# Patient Record
Sex: Male | Born: 1955
Health system: Western US, Academic
[De-identification: ages and names within clinical notes are randomized; demographics above are authoritative.]

---

## 2012-08-17 ENCOUNTER — Ambulatory Visit: Payer: Self-pay | Admitting: Surgery

## 2012-08-17 LAB — CBC WITH DIFFERENTIAL/PLATELET
Basophil #: 0.1 10*3/uL (ref 0.0–0.1)
Basophil %: 1 %
HCT: 45.1 % (ref 40.0–52.0)
Lymphocyte #: 1.9 10*3/uL (ref 1.0–3.6)
MCH: 32.3 pg (ref 26.0–34.0)
Neutrophil %: 67.4 %
Platelet: 285 10*3/uL (ref 150–440)
RBC: 4.67 10*6/uL (ref 4.40–5.90)
WBC: 8.1 10*3/uL (ref 3.8–10.6)

## 2012-08-17 LAB — BASIC METABOLIC PANEL
Anion Gap: 3 — ABNORMAL LOW (ref 7–16)
BUN: 8 mg/dL (ref 7–18)
Calcium, Total: 8.6 mg/dL (ref 8.5–10.1)
Chloride: 103 mmol/L (ref 98–107)
Co2: 28 mmol/L (ref 21–32)
Creatinine: 0.98 mg/dL (ref 0.60–1.30)
EGFR (African American): >60
Glucose: 94 mg/dL (ref 65–99)
Osmolality: 266 (ref 275–301)
Potassium: 5.1 mmol/L (ref 3.5–5.1)
Sodium: 134 mmol/L — ABNORMAL LOW (ref 136–145)

## 2012-09-08 ENCOUNTER — Ambulatory Visit: Payer: Self-pay | Admitting: Surgery

## 2014-03-21 ENCOUNTER — Ambulatory Visit: Payer: Self-pay | Admitting: Surgery

## 2014-03-21 LAB — CREATININE, SERUM
CREATININE: 1.3 mg/dL (ref 0.60–1.30)
EGFR (Non-African Amer.): 60 — ABNORMAL LOW

## 2014-04-04 ENCOUNTER — Ambulatory Visit: Payer: Self-pay | Admitting: Urology

## 2014-04-04 LAB — URINALYSIS, COMPLETE
Glucose,UR: NEGATIVE mg/dL (ref 0–75)
Ketone: NEGATIVE
Nitrite: NEGATIVE
PH: 5 (ref 4.5–8.0)
SPECIFIC GRAVITY: 1.005 (ref 1.003–1.030)
Squamous Epithelial: <1
WBC UR: 11 /HPF (ref 0–5)

## 2014-04-04 LAB — CBC
HCT: 45 % (ref 40.0–52.0)
HGB: 14.8 g/dL (ref 13.0–18.0)
MCH: 31.8 pg (ref 26.0–34.0)
MCHC: 32.9 g/dL (ref 32.0–36.0)
MCV: 97 fL (ref 80–100)
PLATELETS: 298 10*3/uL (ref 150–440)
RBC: 4.65 10*6/uL (ref 4.40–5.90)
RDW: 14 % (ref 11.5–14.5)
WBC: 8.7 10*3/uL (ref 3.8–10.6)

## 2014-04-04 LAB — BASIC METABOLIC PANEL
Anion Gap: 9 (ref 7–16)
BUN: 15 mg/dL (ref 7–18)
CHLORIDE: 97 mmol/L — AB (ref 98–107)
CREATININE: 1.23 mg/dL (ref 0.60–1.30)
Calcium, Total: 8.5 mg/dL (ref 8.5–10.1)
Co2: 26 mmol/L (ref 21–32)
EGFR (Non-African Amer.): >60
Glucose: 97 mg/dL (ref 65–99)
Osmolality: 265 (ref 275–301)
POTASSIUM: 4.3 mmol/L (ref 3.5–5.1)
Sodium: 132 mmol/L — ABNORMAL LOW (ref 136–145)

## 2014-04-06 LAB — URINE CULTURE

## 2014-04-11 ENCOUNTER — Emergency Department: Payer: Self-pay | Admitting: Emergency Medicine

## 2014-04-11 LAB — URINALYSIS, COMPLETE
Bacteria: NONE SEEN
Bilirubin,UR: NEGATIVE
Glucose,UR: NEGATIVE mg/dL (ref 0–75)
Hyaline Cast: 14
KETONE: NEGATIVE
NITRITE: NEGATIVE
PH: 6 (ref 4.5–8.0)
Protein: 100
RBC,UR: 673 /HPF (ref 0–5)
SQUAMOUS EPITHELIAL: NONE SEEN
Specific Gravity: 1.009 (ref 1.003–1.030)

## 2014-04-11 LAB — COMPREHENSIVE METABOLIC PANEL
ALT: 23 U/L
Albumin: 3.3 g/dL — ABNORMAL LOW (ref 3.4–5.0)
Alkaline Phosphatase: 76 U/L
Anion Gap: 5 — ABNORMAL LOW (ref 7–16)
BUN: 10 mg/dL (ref 7–18)
Bilirubin,Total: 0.3 mg/dL (ref 0.2–1.0)
CO2: 35 mmol/L — AB (ref 21–32)
Calcium, Total: 9.1 mg/dL (ref 8.5–10.1)
Chloride: 98 mmol/L (ref 98–107)
Creatinine: 1.35 mg/dL — ABNORMAL HIGH (ref 0.60–1.30)
EGFR (African American): >60
EGFR (Non-African Amer.): 57 — ABNORMAL LOW
Glucose: 106 mg/dL — ABNORMAL HIGH (ref 65–99)
Osmolality: 275 (ref 275–301)
Potassium: 3.9 mmol/L (ref 3.5–5.1)
SGOT(AST): 21 U/L (ref 15–37)
Sodium: 138 mmol/L (ref 136–145)
Total Protein: 7.3 g/dL (ref 6.4–8.2)

## 2014-04-11 LAB — CBC
HCT: 44.8 % (ref 40.0–52.0)
HGB: 14.5 g/dL (ref 13.0–18.0)
MCH: 31.5 pg (ref 26.0–34.0)
MCHC: 32.3 g/dL (ref 32.0–36.0)
MCV: 98 fL (ref 80–100)
Platelet: 324 10*3/uL (ref 150–440)
RBC: 4.59 10*6/uL (ref 4.40–5.90)
RDW: 13.6 % (ref 11.5–14.5)
WBC: 9.5 10*3/uL (ref 3.8–10.6)

## 2014-04-11 LAB — DRUG SCREEN, URINE
Amphetamines, Ur Screen: NEGATIVE (ref ?–1000)
BENZODIAZEPINE, UR SCRN: NEGATIVE (ref ?–200)
Barbiturates, Ur Screen: NEGATIVE (ref ?–200)
COCAINE METABOLITE, UR ~~LOC~~: NEGATIVE (ref ?–300)
Cannabinoid 50 Ng, Ur ~~LOC~~: NEGATIVE (ref ?–50)
MDMA (ECSTASY) UR SCREEN: NEGATIVE (ref ?–500)
Methadone, Ur Screen: NEGATIVE (ref ?–300)
Opiate, Ur Screen: POSITIVE (ref ?–300)
PHENCYCLIDINE (PCP) UR S: NEGATIVE (ref ?–25)
TRICYCLIC, UR SCREEN: NEGATIVE (ref ?–1000)

## 2014-04-11 LAB — ETHANOL

## 2014-04-18 ENCOUNTER — Ambulatory Visit: Payer: Self-pay | Admitting: Urology

## 2014-04-26 ENCOUNTER — Emergency Department: Payer: Self-pay | Admitting: Internal Medicine

## 2014-04-26 LAB — COMPREHENSIVE METABOLIC PANEL
ALK PHOS: 76 U/L
AST: 28 U/L (ref 15–37)
Albumin: 3.2 g/dL — ABNORMAL LOW (ref 3.4–5.0)
Anion Gap: 7 (ref 7–16)
BUN: 11 mg/dL (ref 7–18)
Bilirubin,Total: 0.6 mg/dL (ref 0.2–1.0)
CO2: 30 mmol/L (ref 21–32)
Calcium, Total: 8.5 mg/dL (ref 8.5–10.1)
Chloride: 100 mmol/L (ref 98–107)
Creatinine: 1.73 mg/dL — ABNORMAL HIGH (ref 0.60–1.30)
EGFR (African American): 52 — ABNORMAL LOW
EGFR (Non-African Amer.): 43 — ABNORMAL LOW
Glucose: 145 mg/dL — ABNORMAL HIGH (ref 65–99)
OSMOLALITY: 276 (ref 275–301)
Potassium: 3.7 mmol/L (ref 3.5–5.1)
SGPT (ALT): 28 U/L
Sodium: 137 mmol/L (ref 136–145)
Total Protein: 6.8 g/dL (ref 6.4–8.2)

## 2014-04-26 LAB — URINALYSIS, COMPLETE
BACTERIA: NONE SEEN
Glucose,UR: NEGATIVE mg/dL (ref 0–75)
KETONE: NEGATIVE
NITRITE: NEGATIVE
Ph: 5 (ref 4.5–8.0)
Protein: 100
RBC,UR: 17 /HPF (ref 0–5)
SPECIFIC GRAVITY: 1.012 (ref 1.003–1.030)
Squamous Epithelial: NONE SEEN
WBC UR: 19 /HPF (ref 0–5)

## 2014-04-26 LAB — CBC
HCT: 42.1 % (ref 40.0–52.0)
HGB: 13.8 g/dL (ref 13.0–18.0)
MCH: 31.7 pg (ref 26.0–34.0)
MCHC: 32.8 g/dL (ref 32.0–36.0)
MCV: 97 fL (ref 80–100)
Platelet: 334 10*3/uL (ref 150–440)
RBC: 4.36 10*6/uL — ABNORMAL LOW (ref 4.40–5.90)
RDW: 13.9 % (ref 11.5–14.5)
WBC: 7.7 10*3/uL (ref 3.8–10.6)

## 2014-04-26 LAB — TROPONIN I

## 2014-04-26 LAB — DRUG SCREEN, URINE
Amphetamines, Ur Screen: NEGATIVE (ref ?–1000)
BARBITURATES, UR SCREEN: NEGATIVE (ref ?–200)
BENZODIAZEPINE, UR SCRN: NEGATIVE (ref ?–200)
Cannabinoid 50 Ng, Ur ~~LOC~~: NEGATIVE (ref ?–50)
Cocaine Metabolite,Ur ~~LOC~~: NEGATIVE (ref ?–300)
MDMA (ECSTASY) UR SCREEN: NEGATIVE (ref ?–500)
Methadone, Ur Screen: NEGATIVE (ref ?–300)
Opiate, Ur Screen: NEGATIVE (ref ?–300)
Phencyclidine (PCP) Ur S: NEGATIVE (ref ?–25)
Tricyclic, Ur Screen: POSITIVE (ref ?–1000)

## 2014-04-26 LAB — ETHANOL

## 2014-05-03 ENCOUNTER — Inpatient Hospital Stay: Payer: Self-pay | Admitting: Internal Medicine

## 2014-05-03 LAB — URINALYSIS, COMPLETE
Glucose,UR: 50 mg/dL (ref 0–75)
Hyaline Cast: 5
KETONE: NEGATIVE
Nitrite: NEGATIVE
PH: 5 (ref 4.5–8.0)
Protein: 100
SQUAMOUS EPITHELIAL: NONE SEEN
Specific Gravity: 1.017 (ref 1.003–1.030)

## 2014-05-03 LAB — CBC WITH DIFFERENTIAL/PLATELET
BASOS ABS: 0.1 10*3/uL (ref 0.0–0.1)
BASOS PCT: 0.8 %
Eosinophil #: 0.2 10*3/uL (ref 0.0–0.7)
Eosinophil %: 1.3 %
HCT: 43.4 % (ref 40.0–52.0)
HGB: 14.2 g/dL (ref 13.0–18.0)
LYMPHS ABS: 0.7 10*3/uL — AB (ref 1.0–3.6)
LYMPHS PCT: 6.4 %
MCH: 31.8 pg (ref 26.0–34.0)
MCHC: 32.8 g/dL (ref 32.0–36.0)
MCV: 97 fL (ref 80–100)
MONO ABS: 1.1 x10 3/mm — AB (ref 0.2–1.0)
Monocyte %: 9.4 %
Neutrophil #: 9.5 10*3/uL — ABNORMAL HIGH (ref 1.4–6.5)
Neutrophil %: 82.1 %
PLATELETS: 276 10*3/uL (ref 150–440)
RBC: 4.47 10*6/uL (ref 4.40–5.90)
RDW: 14.1 % (ref 11.5–14.5)
WBC: 11.5 10*3/uL — AB (ref 3.8–10.6)

## 2014-05-03 LAB — COMPREHENSIVE METABOLIC PANEL
ALT: 19 U/L
Albumin: 3 g/dL — ABNORMAL LOW (ref 3.4–5.0)
Alkaline Phosphatase: 72 U/L
Anion Gap: 7 (ref 7–16)
BILIRUBIN TOTAL: 0.9 mg/dL (ref 0.2–1.0)
BUN: 24 mg/dL — ABNORMAL HIGH (ref 7–18)
Calcium, Total: 8.9 mg/dL (ref 8.5–10.1)
Chloride: 105 mmol/L (ref 98–107)
Co2: 25 mmol/L (ref 21–32)
Creatinine: 2.63 mg/dL — ABNORMAL HIGH (ref 0.60–1.30)
EGFR (African American): 32 — ABNORMAL LOW
EGFR (Non-African Amer.): 27 — ABNORMAL LOW
GLUCOSE: 150 mg/dL — AB (ref 65–99)
Osmolality: 281 (ref 275–301)
POTASSIUM: 3.7 mmol/L (ref 3.5–5.1)
SGOT(AST): 20 U/L (ref 15–37)
Sodium: 137 mmol/L (ref 136–145)
Total Protein: 7.7 g/dL (ref 6.4–8.2)

## 2014-05-03 LAB — TROPONIN I

## 2014-05-03 LAB — PROTIME-INR
INR: 1.1
PROTHROMBIN TIME: 13.9 s (ref 11.5–14.7)

## 2014-05-03 LAB — MAGNESIUM: Magnesium: 2.3 mg/dL

## 2014-05-03 LAB — PHOSPHORUS: Phosphorus: 3.6 mg/dL (ref 2.5–4.9)

## 2014-05-04 LAB — BASIC METABOLIC PANEL
Anion Gap: 3 — ABNORMAL LOW (ref 7–16)
BUN: 17 mg/dL (ref 7–18)
CHLORIDE: 108 mmol/L — AB (ref 98–107)
CREATININE: 1.5 mg/dL — AB (ref 0.60–1.30)
Calcium, Total: 8.1 mg/dL — ABNORMAL LOW (ref 8.5–10.1)
Co2: 28 mmol/L (ref 21–32)
EGFR (African American): >60
EGFR (Non-African Amer.): 51 — ABNORMAL LOW
Glucose: 107 mg/dL — ABNORMAL HIGH (ref 65–99)
Osmolality: 280 (ref 275–301)
Potassium: 4.8 mmol/L (ref 3.5–5.1)
SODIUM: 139 mmol/L (ref 136–145)

## 2014-05-04 LAB — DRUG SCREEN, URINE
Amphetamines, Ur Screen: NEGATIVE (ref ?–1000)
Barbiturates, Ur Screen: NEGATIVE (ref ?–200)
Benzodiazepine, Ur Scrn: NEGATIVE (ref ?–200)
CANNABINOID 50 NG, UR ~~LOC~~: NEGATIVE (ref ?–50)
Cocaine Metabolite,Ur ~~LOC~~: NEGATIVE (ref ?–300)
MDMA (ECSTASY) UR SCREEN: NEGATIVE (ref ?–500)
METHADONE, UR SCREEN: NEGATIVE (ref ?–300)
OPIATE, UR SCREEN: NEGATIVE (ref ?–300)
Phencyclidine (PCP) Ur S: NEGATIVE (ref ?–25)
TRICYCLIC, UR SCREEN: NEGATIVE (ref ?–1000)

## 2014-05-04 LAB — CBC WITH DIFFERENTIAL/PLATELET
BASOS PCT: 0.8 %
Basophil #: 0.1 10*3/uL (ref 0.0–0.1)
Eosinophil #: 0.5 10*3/uL (ref 0.0–0.7)
Eosinophil %: 6.4 %
HCT: 34.9 % — ABNORMAL LOW (ref 40.0–52.0)
HGB: 11.5 g/dL — AB (ref 13.0–18.0)
Lymphocyte #: 1.6 10*3/uL (ref 1.0–3.6)
Lymphocyte %: 21.6 %
MCH: 32.1 pg (ref 26.0–34.0)
MCHC: 32.9 g/dL (ref 32.0–36.0)
MCV: 98 fL (ref 80–100)
Monocyte #: 0.8 x10 3/mm (ref 0.2–1.0)
Monocyte %: 11 %
Neutrophil #: 4.5 10*3/uL (ref 1.4–6.5)
Neutrophil %: 60.2 %
Platelet: 223 10*3/uL (ref 150–440)
RBC: 3.58 10*6/uL — AB (ref 4.40–5.90)
RDW: 14.2 % (ref 11.5–14.5)
WBC: 7.4 10*3/uL (ref 3.8–10.6)

## 2014-05-05 LAB — BASIC METABOLIC PANEL
Anion Gap: 8 (ref 7–16)
BUN: 14 mg/dL (ref 7–18)
CALCIUM: 8.1 mg/dL — AB (ref 8.5–10.1)
CHLORIDE: 107 mmol/L (ref 98–107)
CO2: 26 mmol/L (ref 21–32)
CREATININE: 1.06 mg/dL (ref 0.60–1.30)
EGFR (African American): >60
EGFR (Non-African Amer.): >60
Glucose: 85 mg/dL (ref 65–99)
Osmolality: 281 (ref 275–301)
Potassium: 4.1 mmol/L (ref 3.5–5.1)
Sodium: 141 mmol/L (ref 136–145)

## 2014-05-05 LAB — CBC WITH DIFFERENTIAL/PLATELET
BASOS ABS: 0.1 10*3/uL (ref 0.0–0.1)
BASOS PCT: 0.9 %
Eosinophil #: 0.3 10*3/uL (ref 0.0–0.7)
Eosinophil %: 4.5 %
HCT: 33.9 % — ABNORMAL LOW (ref 40.0–52.0)
HGB: 11.1 g/dL — AB (ref 13.0–18.0)
Lymphocyte #: 1.3 10*3/uL (ref 1.0–3.6)
Lymphocyte %: 18.4 %
MCH: 31.7 pg (ref 26.0–34.0)
MCHC: 32.6 g/dL (ref 32.0–36.0)
MCV: 97 fL (ref 80–100)
MONO ABS: 0.7 x10 3/mm (ref 0.2–1.0)
MONOS PCT: 9.4 %
NEUTROS ABS: 4.7 10*3/uL (ref 1.4–6.5)
Neutrophil %: 66.8 %
Platelet: 239 10*3/uL (ref 150–440)
RBC: 3.49 10*6/uL — ABNORMAL LOW (ref 4.40–5.90)
RDW: 14 % (ref 11.5–14.5)
WBC: 7 10*3/uL (ref 3.8–10.6)

## 2014-05-06 LAB — URINE CULTURE

## 2014-05-08 LAB — CULTURE, BLOOD (SINGLE)

## 2014-06-22 ENCOUNTER — Ambulatory Visit: Payer: Self-pay | Admitting: Urology

## 2014-09-02 NOTE — Op Note (Signed)
PATIENT NAME:  Jerry [name], Jerry [name] MR#:  [id number] DATE OF BIRTH:  1956/03/06  DATE OF PROCEDURE:  09/08/2012  PREOPERATIVE DIAGNOSIS: Left inguinal hernia.   POSTOPERATIVE DIAGNOSIS: Left direct inguinal hernia and a large left inguinal cord lipoma.   PROCEDURE PERFORMED:  Left inguinal hernia repair with mesh.   SURGEON: Rossetta Kama A. Gavrielle Streck, MD   ANESTHESIA: General.   ESTIMATED BLOOD LOSS: 10 mL.   COMPLICATIONS: None.   SPECIMEN: Cord lipoma.   INDICATIONS FOR SURGERY: The patient is a pleasant 59 year old gentleman who presented with persistent left groin pain and swelling. He is brought to the operating room suite for a left inguinal hernia repair.   DETAILS OF PROCEDURE: Informed was obtained from the patient. He was brought into the Operating Room suite. He was laid supine on the Operating Room table. He was induced. An endotracheal tube was placed, and general anesthesia was administered. His left groin was prepped and draped in standard surgical fashion. A timeout was then performed for correctly identifying the patient name, operative site and procedure to be performed. A transverse slightly upward-sloping incision was made in his left groin approximately 1 fingerbreadth above his pubic symphysis. This was deepened down through Scarpa's fascia to the aponeurosis of the external abdominal oblique.  The oblique was cleared off.  The external ring was palpated.  A scalpel was used to incise the aponeurosis, and scissors were used to open the aponeurosis proximally and distally to the ring. I then used a Kelly clamp to grasp the aponeurosis and clear out the underside.  I then encircled the spermatic cord and placed a Penrose around it.  I was able to feel a large cord lipoma that I dissected away from the cord. The cord was dissected clean. There was found to be no internal hernia, and the peritoneal reflection was actually found upon palpation through the internal ring; however, his  floor was quite weak, and it was thought that he had a direct hernia. After I had cleaned off the vas deferens and testicular blood supply, proceeded to insert a Prolene keyhole mesh. This was anchored at the pubic tubercle with a 0 Ethibond U-stitch and then was attached proximally to the conjoined tendon using interrupted 0 Ethibond U-stitches and to the shelving edge of the inguinal ligament laterally using interrupted simple 0 Ethibond.  The ring was placed around the cord, and the ring was closed distally using a vest-over-pants U-stitch of 0 Vicryl to approximate the tail. There was a small little bit larger defect than I had hoped; and, therefore, I used a single stitch to further close the ring to allow the tip of a hemostat alongside the cord.  I felt that the floor was weak even posteriorly to the internal ring and, thus, placed a small piece of Prolene to continue and to reinforce the floor proximal to the internal ring.  This was attached to the previously-placed piece of mesh as well as to the conjoined tendon and the shelving edge of the inguinal ligament using 0 Ethibond.  After I was happy with the repair, and that the floor was adequately bolstered, and that were no obvious defects for herniation to occur, I then tucked the mesh proximally over the end of the aponeurosis and external oblique and closed it using a running 3-0 Vicryl.  I then closed Scarpa's using interrupted 3-0 Vicryl and did a running 3-0 deep dermal stitch to approximate the skin.  Prior to this, I infiltrated the cord and the  incision as well as the 1 cm medial to the ASIS using 1% lidocaine with epinephrine. I then closed the skin using a running 4-0 Monocryl subcuticular and placed Steri-Strips, Telfa gauze and Dermabond over the wound. The patient was then awoken, extubated, and brought to the postanesthesia care unit. There were no immediate complications. Needle, sponge, and instrument count was correct at the end of the  procedure.   ____________________________ Si Raiderhristopher A. Kay Shippy, MD cal:cb D: 09/08/2012 16:44:50 ET T: 09/08/2012 20:02:23 ET JOB#: [id number]  cc: Cristal Deerhristopher A. Rojelio Uhrich, MD, <Dictator> Jarvis NewcomerHRISTOPHER A Liron Eissler MD ELECTRONICALLY SIGNED 09/11/2012 19:17

## 2014-09-03 NOTE — H&P (Signed)
PATIENT NAME:  Jerry [name], Jerry [name] MR#:  [id number] DATE OF BIRTH:  [birth year]  DATE OF ADMISSION:  05/03/2014  PRIMARY CARE PROVIDER: Bernie Covey, MD  UROLOGIST: Dr. Erlene Quan  CHIEF COMPLAINT: Fever, right flank pain and weakness.   HISTORY OF PRESENTING ILLNESS: This is a 59 year old Caucasian male patient with history of right ureteral stone status post stent on 04/18/2014 who presents to the Emergency Room complaining of fever of 101 at home along with recurrent chills, mild right flank pain and feeling weak. The patient also mentions that he has been disoriented, but presently is alert and oriented x3.   The patient was started on cefuroxime by Dr. Erlene Quan after his stent was placed, but the patient mentioned that he took it for 3 days and stopped taking it and then he resumed it again and took 4 pills in one day. He has been inconsistent with his medications. He has had some mild right flank pain. No hematuria, has some dysuria, but with the fever and recurrent chills the patient decided to come to the ER. Here he has been found to be afebrile with elevated white count of 11.5 along with UTI. Stent is in place and is being admitted for complicated UTI.  PAST MEDICAL HISTORY: 1.  Chronic back pain.  2.  Arthritis.  3.  Benign prostatic hypertrophy.  4.  Nephrolithiasis status post left ureteral stent recently.  5.  Anxiety.   SOCIAL HISTORY: The patient smokes. No alcohol. No illicit drug use. Ambulates on his own.   FAMILY HISTORY: Hypertension.   ALLERGIES: None.   REVIEW OF SYSTEMS: CONSTITUTIONAL: Complains of fatigue, fever.  EYES: No blurred vision, pain or redness.  ENT: No tenderness, pain or hearing loss. RESPIRATORY: No cough, wheeze, hemoptysis.  CARDIOVASCULAR: No chest pain, orthopnea, edema.  GASTROINTESTINAL: No nausea, vomiting. Has right abdominal pain.  GENITOURINARY: Has some dysuria. ENDOCRINE: No polyuria, nocturia, thyroid problems. HEMATOLOGIC AND  LYMPHATIC: No anemia, easy bruising, bleeding.  INTEGUMENTARY: No acne, rash lesion.  MUSCULOSKELETAL: Has chronic back pain. NEUROLOGIC: No focal numbness. PSYCHIATRY: No anxiety or depression.   HOME MEDICATIONS: 1.  Cefuroxime 250 one tablet oral 2 times a day. The patient presently not taking this. 2.  Clonazepam 1.5 mg at bedtime as needed.  3.  Etodolac 300 mg oral every 6 hours as needed.  4.  Metoprolol tartrate 50 mg oral 3 times a day.  5.  Oxymetazoline nasal spray 2 sprays 2 times a day.  6.  Suboxone 8/2 mg sublingual 2 once a day.   PHYSICAL EXAMINATION:  VITAL SIGNS: Temperature 98.4, pulse 79, blood pressure 114/81, saturating 96% on room air.  GENERAL: Obese, Caucasian male patient lying in bed, overall seems comfortable, conversational, cooperative with exam.  PSYCHIATRIC: Alert and oriented x3. Mood and affect appropriate. Judgment intact.  HEENT: Atraumatic, normocephalic. Mucosa is dry and pink. External ears and nose normal. No pallor. No icterus. Pupils bilaterally equal and reactive to light.  NECK: Supple. No thyromegaly. No palpable lymph nodes. Trachea midline. No carotid bruit or JVD.  CARDIOVASCULAR: S1, S2 without any murmurs. Peripheral pulses 2+. No edema.  RESPIRATORY: Normal work of breathing. Clear to auscultation on both sides.  GASTROINTESTINAL: Soft abdomen. Tenderness in the right flank area. No rigidity or guarding. Bowel sounds present. No hepatosplenomegaly palpable. No right lower quadrant pain or Murphy sign.  GENITOURINARY: No CVA tenderness.  MUSCULOSKELETAL: No joint swelling, redness, effusion of the large joints. Normal muscle tone.  NEUROLOGICAL: Motor strength 5/5 in  upper and lower extremities. Sensation is intact all over.  LYMPHATIC: No cervical lymphadenopathy.  DIAGNOSTIC DATA: Glucose 150, BUN 24, creatinine 2.63, sodium 137, potassium 3.7, chloride 105. AST, ALT, alk phos and bilirubin normal. Troponin less than 0.02. Urine drug  screen positive for TCA.   WBC 11.5, hemoglobin 14, platelets 276,000, neutrophils 82%. INR 1.1.   Urinalysis shows 300 WBC and trace bacteria. No epithelial cells.   Lactic acid 0.9.   CT scan of the abdomen and pelvis without contrast shows concern for possible acalculous cholecystitis. He has dilated CBD of 10 mm, which is chronic. Left-sided hydronephrosis with a double J stent. Nonobstructing calculi in both kidneys. Nothing else acute.   Chest x-ray shows mild left basilar atelectasis.   ASSESSMENT AND PLAN: 1.  Complicated urinary tract infection in a patient with left ureteral stent in spite of being on antibiotics. The patient has not been compliant with antibiotics. We will send for urine blood cultures as he has had recurrent fever at home as high as 101. He has elevated white count, also has acute renal failure. He will be aggressively fluid resuscitated. Start on ceftriaxone. Will consult urology, Dr. Erlene Quan, for further input with the case. Further management as per culture results and his creatinine trend.  2.  Dilated common bile duct. The patient has had dilated common bile duct of 10 mm, even in his prior CT abdomen, which I have reviewed. There is some concern that there could be acalculous cholecystitis, but the patient has normal liver function tests. No right upper quadrant pain. No symptoms with food, and he will need outpatient follow up with gastroenterology. 3.  Deep vein thrombosis prophylaxis with Lovenox.   CODE STATUS: FULL code.   TIME SPENT TODAY ON THIS CASE: 50 minutes.  ____________________________ Jerry Alf Maleiyah Releford, MD srs:sb D: 05/03/2014 11:36:55 ET T: 05/03/2014 12:05:23 ET JOB#: [id number]  cc: Alveta Heimlich R. Darvin Neighbours, MD, <Dictator> Sherlynn Stalls, MD L. Bernie Covey, MD [place] MD ELECTRONICALLY SIGNED 05/03/2014 20:55

## 2014-09-03 NOTE — Consult Note (Signed)
Chief Complaint:  Subjective/Chief Complaint remains afebrile, creatinine improving with hydration.  drop in Hbg noted.  urine remains clear.  patient continues to lose track of time and is confused at times.  urine culture growing YEAST.   VITAL SIGNS/ANCILLARY NOTES: **Vital Signs.:   23-Dec-15 13:59  Vital Signs Type Routine  Temperature Temperature (F) 98.3  Celsius 36.8  Temperature Source oral  Pulse Pulse 58  Respirations Respirations 20  Systolic BP Systolic BP 384  Diastolic BP (mmHg) Diastolic BP (mmHg) 72  Mean BP 87  Pulse Ox % Pulse Ox % 93  Pulse Ox Activity Level  At rest  Oxygen Delivery Room Air/ 21 %  *Intake and Output.:   Daily 23-Dec-15 07:00  Grand Totals Intake:  3522 Output:  1250    Net:  2272 24 Hr.:  2272  Oral Intake      In:  480  IV (Primary)      In:  3042  Urine ml     Out:  1250  Length of Stay Totals Intake:  3522 Output:  1250    Net:  2272   Brief Assessment:  GEN well developed, well nourished, no acute distress   Cardiac Regular   Respiratory normal resp effort   Gastrointestinal Normal   Gastrointestinal details normal Soft  Nontender  Nondistended  No rebound tenderness  No gaurding  No CVA tenderness bilaterally   EXTR negative cyanosis/clubbing   Lab Results: Routine Micro:  22-Dec-15 07:48   Organism Name YEAST  Organism Quantity 50,000 CFU/ML  Micro Text Report URINE CULTURE   ORGANISM 1                50,000 CFU/ML YEAST   COMMENT                   ID TO FOLLOW   COMMENT                   ONCE ISOLATED   ANTIBIOTIC                       Specimen Source CLEAN CATCH  Organism 1 50,000 CFU/ML YEAST  Culture Comment ID TO FOLLOW  Culture Comment . ONCE ISOLATED  Result(s) reported on 04 May 2014 at 10:47AM.  Routine Chem:  23-Dec-15 05:29   Glucose, Serum  107  BUN 17  Creatinine (comp)  1.50  Sodium, Serum 139  Potassium, Serum 4.8  Chloride, Serum  108  CO2, Serum 28  Calcium (Total), Serum  8.1   Anion Gap  3  Osmolality (calc) 280  eGFR (African American) >60  eGFR (Non-African American)  51 (eGFR values <40m/min/1.73 m2 may be an indication of chronic kidney disease (CKD). Calculated eGFR, using the MRDR Study equation, is useful in  patients with stable renal function. The eGFR calculation will not be reliable in acutely ill patients when serum creatinine is changing rapidly. It is not useful in patients on dialysis. The eGFR calculation may not be applicable to patients at the low and high extremes of body sizes, pregnant women, and vegetarians.)  Routine UA:  22-Dec-15 07:48   Color (UA) Yellow  Clarity (UA) Cloudy  Glucose (UA) 50 mg/dL  Bilirubin (UA) 2+  Ketones (UA) Negative  Specific Gravity (UA) 1.017  Blood (UA) 2+  pH (UA) 5.0  Protein (UA) 100 mg/dL  Nitrite (UA) Negative  Leukocyte Esterase (UA) 3+ (Result(s) reported on 03 May 2014 at 08:41AM.)  RBC (UA) 154 /HPF  WBC (UA) 311 /HPF  Bacteria (UA) TRACE  Epithelial Cells (UA) NONE SEEN  WBC Clump (UA) PRESENT  Budding Yeast (UA) PRESENT  Hyaline Cast (UA) 5 /LPF  WBC Cast (UA) 5 /LPF (Result(s) reported on 03 May 2014 at 08:41AM.)  Routine Hem:  23-Dec-15 05:29   WBC (CBC) 7.4  RBC (CBC)  3.58  Hemoglobin (CBC)  11.5  Hematocrit (CBC)  34.9  Platelet Count (CBC) 223  MCV 98  MCH 32.1  MCHC 32.9  RDW 14.2  Neutrophil % 60.2  Lymphocyte % 21.6  Monocyte % 11.0  Eosinophil % 6.4  Basophil % 0.8  Neutrophil # 4.5  Lymphocyte # 1.6  Monocyte # 0.8  Eosinophil # 0.5  Basophil # 0.1 (Result(s) reported on 04 May 2014 at 06:40AM.)   Assessment/Plan:  Assessment/Plan:  Assessment 59 yo M s/p left URS, LL, stent on 04/18/14 for large 1.5 cm UPJ stone admitted with pneumonia, UTI, and ARF.  Renal function improving to 1.5 with hydration, drop in Hgb suggestive of previous hemoconcentration (dehydration) and heavy  NDSAID abuse.  No concern for obstruction contributing to ARF as stent remains in  good position with no signficant hydronephrosis.  UCx growing 50K yeast, would treat in setting of fever.  Mental status/ memory continues to be an issue.  Case discussed today with hospitalist.   Plan -no plan for surgical intervention during this procedure -recommend follow up next week in clinic to discuss whether to return to OR to clear residual stone dust or allow for spontaneous passage of fragments -please start antifungal in addition to abx -defer to medicine team to address/ evaluate memory/ attention issues, appreciate your input here -will sign off, please call/ page with any questions or concerns   Electronic Signatures: Sherlynn Stalls (MD)  (Signed 23-Dec-15 15:46)  Authored: Chief Complaint, VITAL SIGNS/ANCILLARY NOTES, Brief Assessment, Lab Results, Assessment/Plan   Last Updated: 23-Dec-15 15:46 by Sherlynn Stalls (MD)

## 2014-09-03 NOTE — Op Note (Signed)
PATIENT NAME:  Jerry BillsFULKERSON, Elijahjames A MR#:  [id number] DATE OF BIRTH:  Jul 21, 1955  DATE OF PROCEDURE:  04/04/2014  PREOPERATIVE DIAGNOSIS:    Left ureteropelvic junction stone.   POSTOPERATIVE DIAGNOSIS:  Left ureteropelvic junction stone.  PROCEDURE PERFORMED:  Left retrograde pyelogram, left ureteral stent placement.   ANESTHESIA: General anesthesia.   ATTENDING SURGEON: Vanna ScotlandAshley Amarria Andreasen, MD    ESTIMATED BLOOD LOSS: Minimal.   DRAINS: A 6 x 26 French double-J ureteral stent on the left.   SPECIMENS: Urine culture.   COMPLICATIONS: None.   INDICATION: This is a 59 year old male with a history of chronic left groin pain who underwent an inguinal hernia repair, who ultimately underwent a CT scan due to residual pain and was found to have a large left UPJ stone.  He was counseled on his various treatment options and has elected to undergo left ureteral stent placement with plan for a staged procedure in the future. Risks and benefits of the procedure were explained in detail. The patient agreed to proceed as planned.   PROCEDURE: The patient was correctly identified in preoperative holding area and informed consent was obtained.  He was brought to the operating suite, placed on the table in supine position. At this time, a universal timeout protocol was performed. All tumors were identified. Venodyne boots were placed and he was administered IV ceftriaxone perioperative.  He was then placed under general anesthesia, repositioned lower on the bed in the dorsal lithotomy position and prepped and draped in a surgical fashion.  At this point in time, a rigid cystoscope using a 22 French access sheath was advanced per urethra into the bladder.  Of note, he had a moderately enlarged prostatic fossa with an elevated bladder neck.  His bladder was also mildly trabeculated. Attention was then turned to the left ureteral orifice which was cannulated using a 5 [place]French open-ended ureteral catheter and a retrograde  pyelogram was performed. This revealed a delicate -appearing ureter without any ureteral filling defects or dilation.  Up to the level of the UPJ a filling defect could be seen here consistent with the stone and previous stone shattering prior to the retrograde pyelogram. There was minimal hydronephrosis.  A wire was then placed through the 5 [place]French open-ended, up to the level of the renal pelvis, confirmed under fluoroscopic guidance, around the stone without difficulty.  A 6 x 24 French double-J ureteral stent was then advanced over the wire to the level of the renal pelvis.  The wire was then partially withdrawn. A coil was noted within the renal pelvis. The wire was then fully withdrawn, and a coil was noted within the bladder.  No string was left on the stent.  Of note, at this point in time, there was efflux of a lot of stone debris and urine culture was sent after the stone had been draining for some time, to ensure that there were no organisms trapped behind the stone.  The bladder was then drained. The patient was turned to the supine position, reversed from anesthesia and taken to the PACU in stable condition. There were no complications.      ____________________________ Claris GladdenAshley J. Kortez Murtagh, MD ajb:DT D: 04/04/2014 16:48:42 ET T: 04/04/2014 17:21:28 ET JOB#: [id number]  cc: Claris GladdenAshley J. Dawud Mays, MD, <Dictator> Claris GladdenASHLEY J Jase Himmelberger MD ELECTRONICALLY SIGNED 04/27/2014 10:49

## 2014-09-03 NOTE — Discharge Summary (Signed)
PATIENT NAME:  Jerry [name], Jerry [name] MR#:  [id number] DATE OF BIRTH:  31-Mar-1956  DATE OF ADMISSION:  05/03/2014 DATE OF DISCHARGE:  05/05/2014  PRESENTING COMPLAINT: Fever, right flank pain and weakness.   DISCHARGE DIAGNOSES:  1.  Acute renal failure, suspected due to nonsteroidal anti-inflammatory use.  2.  Left ureteral stone, status post stent in the past.   MEDICATIONS: 1.  Suboxone 8 mg p.o. daily.  2.  Clonazepam 1 mg 1-1/2 tablets daily at bedtime as needed.  3.  Oxymetazoline nasal spray 2 sprays b.i.d.  4.  Metoprolol 50 mg 3 times [name] day.  5.  Acetaminophen 325 two tablets every 4 hours as needed.  6.  Fluoxetine 20 mg daily.  7.  Fluconazole 100 mg daily.  8.  Keflex 500 mg b.i.d.   FOLLOWUP:  1.  With Claris GladdenAshley J. Brandon, MD, urology, as outpatient.  2.  With your primary physician, Burley SaverL. Katherine Bliss, MD, as outpatient.   CONSULTATION:  1.  Urology, Claris GladdenAshley J. Brandon, MD. 2.  Psychiatric consultation with Audery AmelJohn T. Clapacs, MD.  BRIEF SUMMARY OF HOSPITAL COURSE: Jerry [name] is [name] with history of chronic pain medication use, chronic narcotic dependence who is on Suboxone, nephrolithiasis, depression, comes in with acute renal failure, ATN, could be due to nonsteroidal anti-inflammatory use. The patient has been taking high prescription strength Motrin as outpatient. The patient's nonsteroidal anti-inflammatories were held. CT of abdomen with no acute obstruction changes in his kidney. Creatinine is stable. Received IV fluids.   UTI, status post recent ureteral stent. Dr. Apolinar JunesBrandon saw the patient and recommended outpatient followup. Changed to p.o. Keflex and added fluconazole for yeast in the urine.   Altered mental status, concern for drug abuse, history of narcotic overuse in the past, on Suboxone. He is currently on the Suboxone program. The patient admits to using oxycodone. Psychiatric consult was done by Dr. Toni Amendlapacs and recommends to continue  current medications including Suboxone and follow up with outpatient psychiatrist, which the patient already has an appointment. His mental status is stable.   Hypertension, on metoprolol.   Depression. Restart Prozac.   Hospital stay otherwise remained stable.   CODE STATUS: The patient remained [name] full code.   TIME SPENT: Forty minutes.    ____________________________ Wylie HailSona [name]. Allena KatzPatel, MD sap:TT D: 05/09/2014 16:13:51 ET T: 05/09/2014 20:16:02 ET JOB#: [id number]  cc: Almarie Kurdziel [name]. Allena KatzPatel, MD, <Dictator> Willow OraSONA [name] Arena Lindahl MD ELECTRONICALLY SIGNED 05/10/2014 18:39

## 2014-09-03 NOTE — Op Note (Signed)
PATIENT NAME:  Jerry [name], Jerry [name] MR#:  [id number] DATE OF BIRTH:  [birth year]  DATE OF PROCEDURE:  04/18/2014  PREOPERATIVE DIAGNOSIS: Left ureteropelvic junction stone.   POSTOPERATIVE DIAGNOSIS: Left ureteropelvic junction stone.   PROCEDURE PERFORMED: Left ureteroscopy, laser lithotripsy, left ureteral stent exchange.   ATTENDING SURGEON: Claris Gladden, MD  ANESTHESIA: General.   ESTIMATED BLOOD LOSS: Minimal.   DRAINS: A 6 x 26 French double-J ureteral stent.   SPECIMENS: Stone fragments.   COMPLICATIONS: None.   INDICATION: This is a 59 year old male with history of chronic pain and narcotic abuse who was found to have a very large approximately 1.5 cm left UPJ stone. He previously underwent ureteral stent placement for alleviation of his pain. He returns today for definitive management of his stone. Risks and benefits of the procedure were explained in detail. The patient agreed to proceed as planned.   DESCRIPTION OF PROCEDURE: The patient was correctly identified in the preoperative holding area and informed consent was confirmed. He was brought to the operating suite and placed on the table in the supine position. At this time, a universal timeout protocol was performed. All team members were identified. Venodyne boots were placed and he was administered 500 mg of IV Levaquin in the perioperative period. He was then placed under general anesthesia and repositioned lower on the bed in the dorsal lithotomy position. He was prepped and draped in standard surgical fashion. A rigid cystoscope was then advanced per urethra into the bladder. The distal coil in the left ureteral stent was identified and grasped and brought to up to the level of the urethral meatus. A Sensor wire was then used to cannulate this up to the level of the renal pelvis past the stone without difficulty and the stent was removed. A dual-lumen introducer was then used just within the UO under fluoroscopic guidance  and a second wire was advanced to the pelvis as well. One wire was snapped in place and the second wire was used as a working wire. A ureteral access sheath 35 cm and 12/14 in diameter was advanced up to the level of the proximal ureter and the inner cannula was removed. This was done without difficulty. A flexible cystoscope was then advanced up to the level of the UPJ and the stone was identified. A 365 micron laser fiber was then used using the settings of 0.2 and 50 Hz to dust this stone into very small pieces. The stone was quite hard and very large. Once this was satisfactorily fragmented, the larger pieces were basketed out serially until the kidney was deemed clear of any significant fragments. There were very small fragments as well as dust particles which felt would pass with the stent in place. A retrograde pyelogram was performed at the level of the renal pelvis and care was taken to inspect all calyces for any significant stone fragment which were negative. The ureteral access sheath was then scoped out under direct visualization with care taken to ensure that there was no evidence of ureteral injury or obstructing stone fragment which were not identified. The safety wire was then backloaded over the rigid cystoscope, which was reintroduced into the bladder and a 6 x 26 French double-J ureteral stent was advanced to the level of the renal pelvis. The wire was then partially withdrawn and a coil was noted within the renal pelvis. The wire was then fully withdrawn and a coil was noted within the bladder. The bladder was then drained and the scope  was removed. The patient was reversed from anesthesia after being repositioned in the supine position and taken to the PACU in stable condition. There were no complications.    ____________________________ Claris GladdenAshley J. Marcelo Ickes, MD ajb:TT D: 04/18/2014 15:36:33 ET T: 04/18/2014 16:11:09 ET JOB#: [id number]  cc: Claris GladdenAshley J. Shiv Shuey, MD, <Dictator> Claris GladdenASHLEY J  Bridney Guadarrama MD ELECTRONICALLY SIGNED 04/27/2014 10:57

## 2014-09-07 NOTE — Consult Note (Signed)
PATIENT NAME:  Jerry [name], Baer A MR#:  [id number] DATE OF BIRTH:  18-Jul-1955  DATE OF CONSULTATION:  05/03/2014  REFERRING PHYSICIAN:  Emergency Room CONSULTING PHYSICIAN:  Claris GladdenAshley J. Preslie Depasquale, MD  REASON FOR CONSULTATION: Acute renal failure, history of kidney stones, UTI.  HISTORY OF PRESENT ILLNESS: This is a 59 year old male with a history of a large approximately 1.5 cm left UPJ stone who underwent left ureteroscopy, laser lithotripsy on December 7th at which time his stone was dusted. He has returned to the clinic now twice since the time of surgery with the intent for a cystoscopy, stent removal; however, on each occasion he was noted to be hypotensive with altered mental status. He does have a history of narcotic abuse and on one occasion was felt to be acutely intoxicated in the office. At the last office visit, due to his hypotension and altered mental status, he was sent via EMS to Emergency Room for evaluation on December 15th. At that time, he was noted to have a rising creatinine up from his baseline of 1.23.   Today, the patient was admitted after calling EMS due to dizziness, sensation of feeling as though he was losing time as well as a fever to 101.7, which was per patient's report new. He denied any increased flank pain or worsening dysuria since the time of the procedure. He denies nausea or vomiting. He does report chills and rigors as well as jitteriness, which he felt may be attributed to narcotic withdrawal. He has had some mild dysuria since the time of the procedure, but no further gross hematuria or changes in his urinary symptoms today.   In the ER today, he was afebrile, normotensive, nontachycardic and hemodynamically stable. He did have a slightly elevated white count to 11.5, but is also found to have acute renal failure with a creatinine of 2.63 with an overall upward trend over the past few weeks. The patient does admit that he is not drinking much water and has been using a  large amount of Motrin as well as ketorolac for pain control. His UA is somewhat suspicious for urinary tract infection, although not out of the realm of what could be expected for postoperative UA. A CT scan was also performed today and reviewed, revealing slight left perinephric stranding with fullness in the renal pelvis, which is unchanged from preoperative. The stent is in good position. There is a good amount of stone dust seen, especially in the left lower pole of the kidney. There is no evidence of obstructing ureteral fragments alongside of the stent. There is also a suggestion of a calculus cholecystitis on CT scan. Chest x-ray does show mild left basilar airspace opacity consistent with mild pneumonia as well.   PAST MEDICAL HISTORY: 1.  Low back pain.  2.  Arthritis. 3.  BPH. 4.  Anxiety. 5.  Narcotic abuse.  PAST SURGICAL HISTORY: 1.  Left ureteroscopy, laser lithotripsy.  2.  Tonsillectomy. 3.  Right inguinal hernia repair.   OUTPATIENT MEDICATIONS: Include Suboxone 8 mg 2 sublingual each day, oxymetazoline nasal spray 2 sprays nasally b.i.d., metoprolol 50 mg t.i.d., ketorolac 300 mg q. 6 hours, clonazepam 1 mg daily p.r.n., cefuroxime 250 mg daily which the patient has not been taking as prescribed.   ALLERGIES: No known drug allergies.   FAMILY HISTORY: Noncontributory.   REVIEW OF SYSTEMS: A 12-point review of systems was performed and was negative other than as per HPI.  PHYSICAL EXAMINATION:  VITAL SIGNS: Temperature current is 97.8,  per patient temperature maximum 101.7, pulse is 65, blood pressure is 128/89, respirations 20, on room air 96%. GENERAL: No acute distress, slightly disheveled. He is alert and oriented x 3 today, although has not been in the past at times.  HEENT: Normocephalic, atraumatic. Mucous membranes slightly dry. Good dentition. NECK: Supple. No masses.  LUNGS: No increased work of breathing or use of accessory muscles. No respiratory distress.   CARDIOVASCULAR: No clubbing, cyanosis or edema bilaterally.  ABDOMEN: Soft, nontender, nondistended. No rebound or guarding. No CVA tenderness bilaterally.  GENITOURINARY: Normal phallus. No discharge. No scrotal enlargement. No scrotal masses. Normal testicles bilaterally. SKIN: No rashes or bruises. No suspicious lesions. NEUROLOGICAL: Cranial nerves grossly intact. No focal deficit. Normal strength and sensation throughout.  PSYCHIATRIC: The patient does loose train of though quickly and unable to formulate a cohesive story. At times, he stopped mid sentence and is unsure what he had just said. His short-term memory also appears to be impaired and does not recall me calling EMS and sending him to the Emergency Room last week.   LABORATORY AND RADIOLOGY: As per HPI above.   ASSESSMENT AND PLAN: This is a 59 year old male with a large left ureteropelvic junction stone, status post a ureteroscopy on December 7th, who is admitted today with fevers, slight leukocytosis to 11, slightly suspicious urinalysis was urine cultures pending and acute renal failure. CT scan reviewed, which does show some residual stone debris, but there is no evidence of an obstructed stent and I do not suspect this his acute renal failure is related to his recent surgery, rather suspect this is related to overuse of ketorolac and Motrin as well as dehydration. I do not recommend any surgical intervention at this time, and would go ahead and treat with antibiotics as currently prescribed (ceftriaxone) and adjust this on urine culture sensitivity and sensitivity data. I briefly discussed with the patient plan for elective outpatient return to OR to clear the residual stone fragment, which is somewhat expected given the size of the stone and amount of stone debris at the time of surgery. I do have concerns about the patient's substance abuse history as well as his current mental status, but it appears to wax and wane. I have discussed  this briefly with covering hospitalist and will discuss this further with the admitting physician in the a.m.  as this may be worth working up further. I will continue to follow closely with this patient. Please call with any questions or concerns. Do not hesitate to contact me.   ____________________________ Claris Gladden, MD ajb:TT D: 05/03/2014 18:06:12 ET T: 05/03/2014 18:55:46 ET JOB#: [id number]  cc: Claris Gladden, MD, <Dictator> Claris Gladden MD ELECTRONICALLY SIGNED 05/25/2014 15:45

## 2016-07-17 IMAGING — CT CT ABD-PELV W/ CM
2 of 5 series · 16 of 46 positions shown, 18 images · IV contrast (isovue)
Comparison: None.

CLINICAL DATA: Pt has hx of right and left lower hernia repair.
Currently presents with left lower quadrant pain above previous
Hernia repair site. Pt states left hernia repaired Saturday August, 2012 and
has had discomfort/pain in the area since. No hx of CA. No other
surgery to abdomen or pelvis area.

EXAM:
CT ABDOMEN AND PELVIS WITH CONTRAST
TECHNIQUE: Multidetector CT imaging of the abdomen and pelvis was performed
using the standard protocol following bolus administration of
intravenous contrast.
CONTRAST:  100 cc Isovue 370

[Series 2: axial soft tissue · axial · 0.84mm/px · z∈[-826,-416]mm · 13 of 94 slices shown, 15 images]
[im 6/94  soft-tissue]
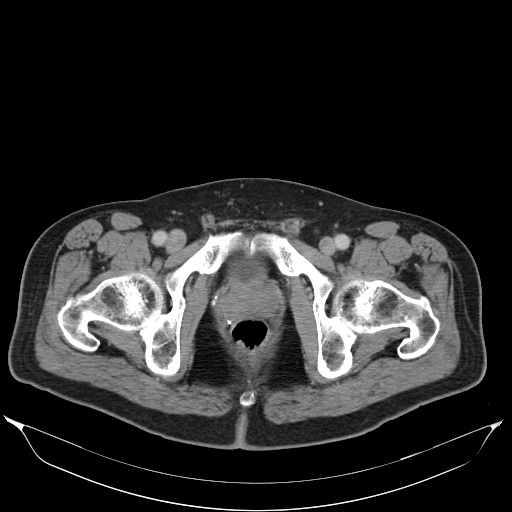
[im 6/94  bone]
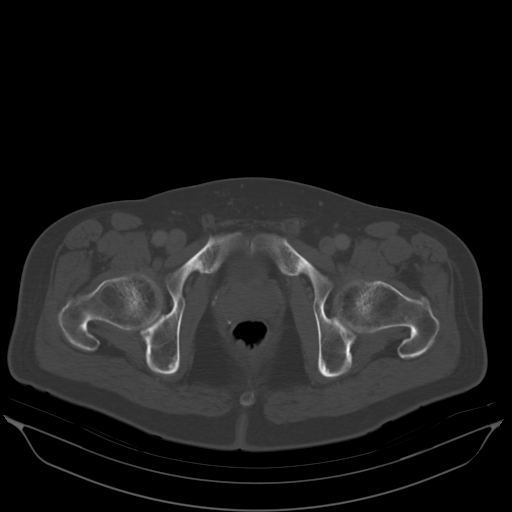
[im 11/94  soft-tissue]
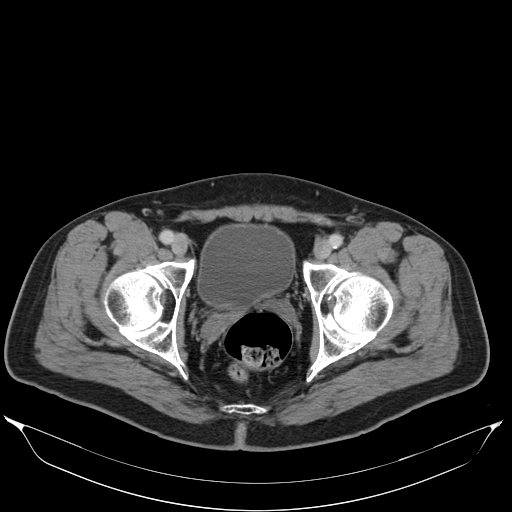
[im 21/94  soft-tissue]
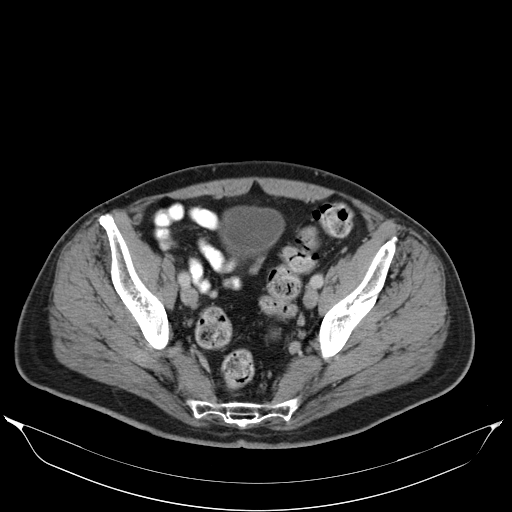
[im 26/94  soft-tissue]
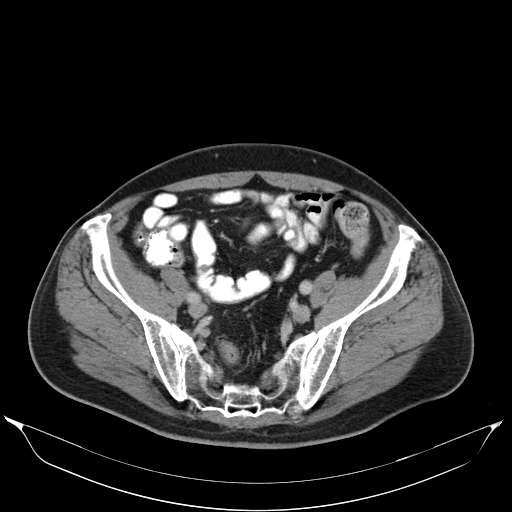
[im 32/94  soft-tissue]
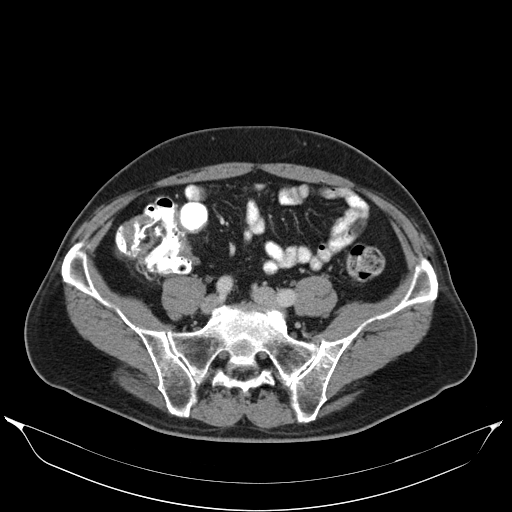
[im 42/94  soft-tissue]
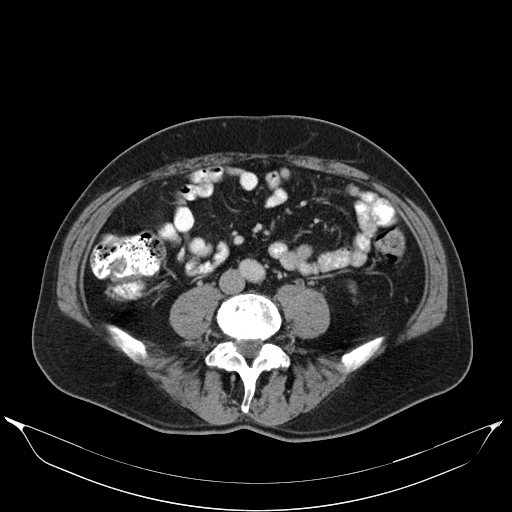
[im 47/94  soft-tissue]
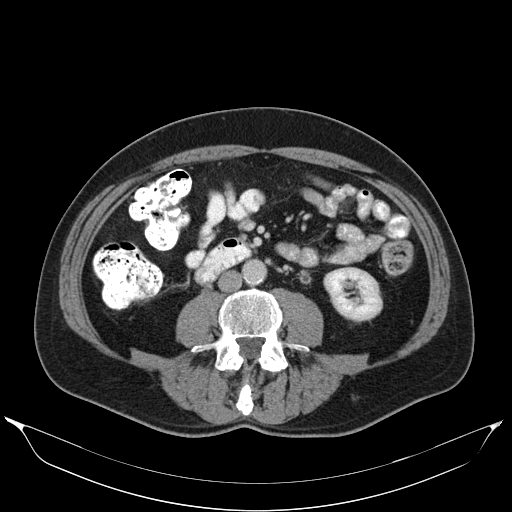
[im 52/94  soft-tissue]
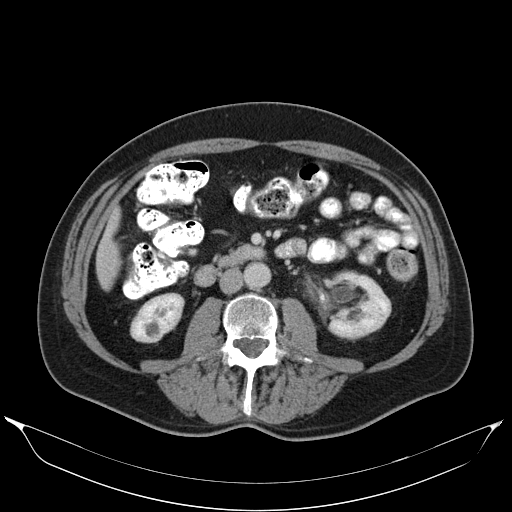
[im 63/94  soft-tissue]
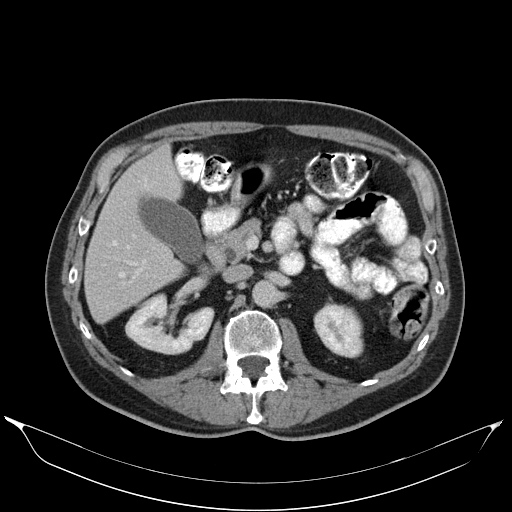
[im 63/94  bone]
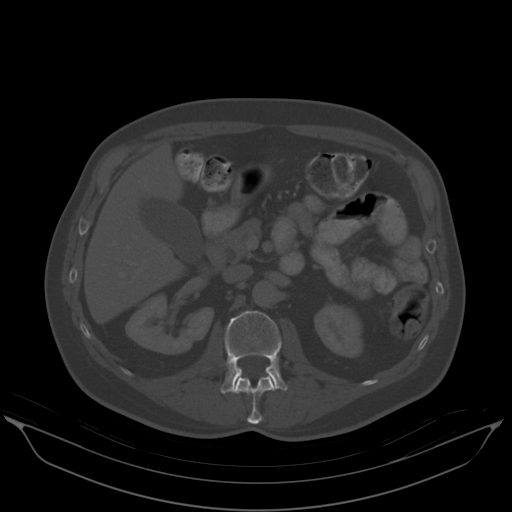
[im 68/94  soft-tissue]
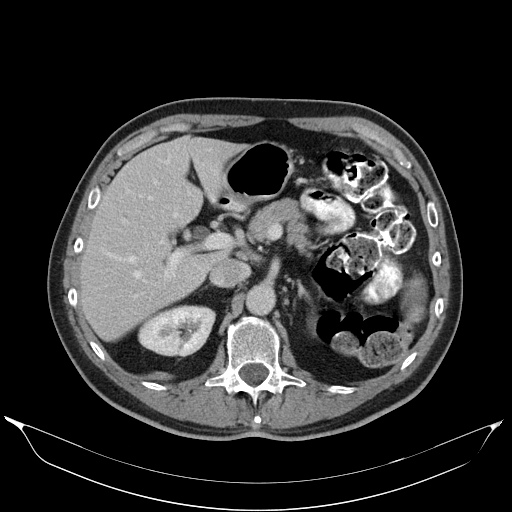
[im 73/94  soft-tissue]
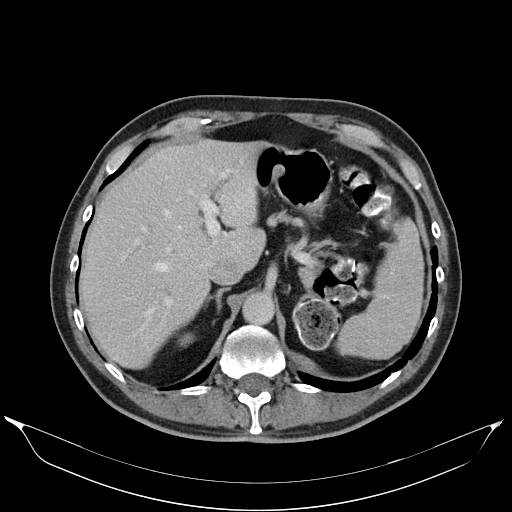
[im 83/94  soft-tissue]
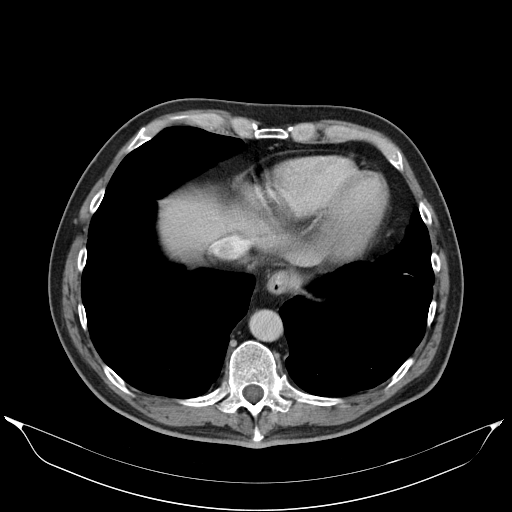
[im 88/94  soft-tissue]
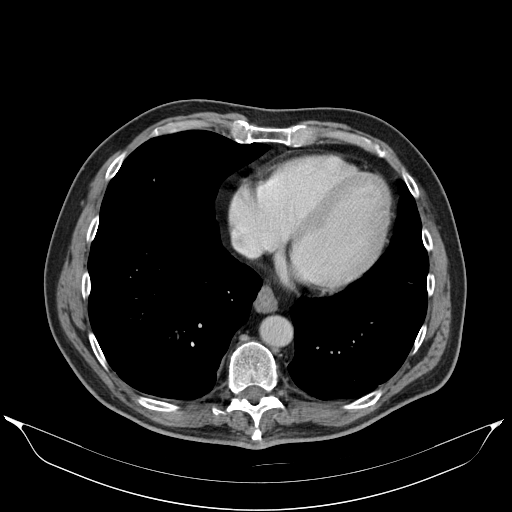

[Series 602: coronal · coronal · 0.91mm/px · 3 of 111 slices shown]
[im 37/111  soft-tissue]
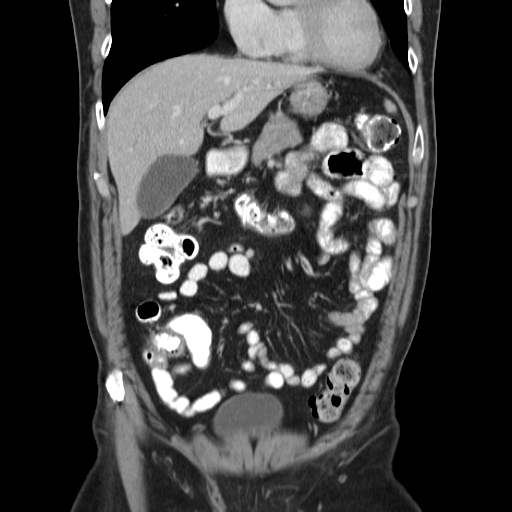
[im 49/111  soft-tissue]
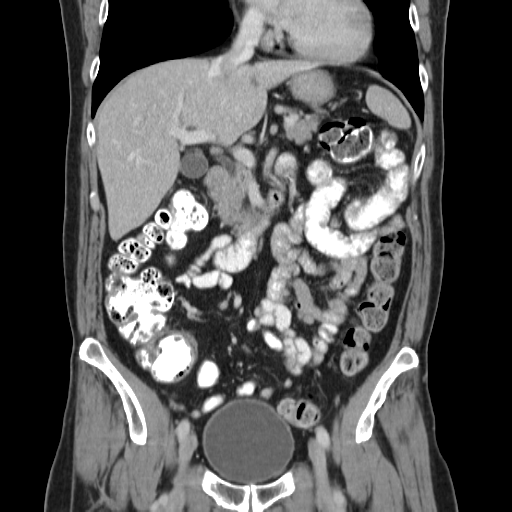
[im 62/111  soft-tissue]
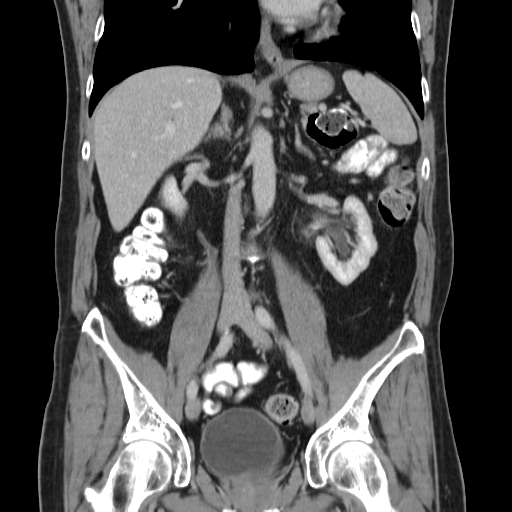

[16 of 46 positions shown; findings below may reference images not displayed]

FINDINGS: Lower chest: Bibasilar scarring. Normal heart size without
pericardial or pleural effusion.

Hepatobiliary: Normal liver and gallbladder. Common duct measures up
to 10 mm proximally on on image 42. Normal in this age group 7-8 mm.
No obstructive stone or mass seen. The duct tapers to 7 mm in the
region of the pancreatic head.

Pancreas: Normal, without mass or pancreatic ductal dilatation.

Spleen: Normal

Adrenals/Urinary Tract: Normal adrenal glands. Multiple right
greater the left renal collecting system calculi. Bilateral renal
cysts and too small to characterize lesions.

Moderate left-sided caliectasis secondary to a left renal pelvic
stone. This measures 1.3 cm. The proximal left ureter is mildly
inflamed, felt to be secondary. No distal hydroureter or bladder
stone/mass.

Stomach/Bowel: Normal stomach, without wall thickening. Normal
terminal ileum and appendix. Normal small bowel without abdominal
ascites.

Vascular/Lymphatic: No aneurysm. No abdominopelvic adenopathy.

Reproductive: Normal prostate.

Other:  No significant free fluid.

Musculoskeletal: L4-5 and L5-S1 degenerative disc disease.
IMPRESSION: 1. Obstructive left renal pelvic 13 mm calculus.
2. Bilateral nephrolithiasis.
3. Mild common duct dilatation for age. No cause identified.
Consider correlation with bilirubin levels.

## 2018-10-25 ENCOUNTER — Ambulatory Visit: Payer: BLUE CROSS/BLUE SHIELD

## 2018-10-25 ENCOUNTER — Inpatient Hospital Stay
Admit: 2018-10-25 | Discharge: 2018-10-26 | Disposition: A | Discharge status: Other Acute Inpatient Hospital | Payer: BLUE CROSS/BLUE SHIELD | Source: Home / Self Care

## 2018-10-25 DIAGNOSIS — Z96642 Presence of left artificial hip joint: Secondary | ICD-10-CM

## 2018-10-25 DIAGNOSIS — W19XXXA Unspecified fall, initial encounter: Secondary | ICD-10-CM

## 2018-10-25 DIAGNOSIS — S7290XA Unspecified fracture of unspecified femur, initial encounter for closed fracture: Secondary | ICD-10-CM

## 2018-10-25 LAB — Extra Gold Top

## 2018-10-25 LAB — Extra Light Green Top

## 2018-10-25 LAB — Glucose, Whole Blood: GLUCOSE, WHOLE BLOOD: 79 mg/dL (ref 65–99)

## 2018-10-25 LAB — Prothrombin Time Panel: INR: 1.1 s (ref 11.5–14.4)

## 2018-10-25 LAB — Electrolyte Panel: ANION GAP: 7 mmol/L — ABNORMAL LOW (ref 8–19)

## 2018-10-25 LAB — CREATININE: CREATININE: 1.28 mg/dL (ref 0.60–1.30)

## 2018-10-25 LAB — APTT: APTT: 28.9 s (ref 24.4–36.2)

## 2018-10-25 LAB — BLOOD BANK HOLD SPECIMEN

## 2018-10-25 LAB — CBC: NUCLEATED RBC%, AUTOMATED: 0 (ref 79.3–98.6)

## 2018-10-25 MED ADMIN — ONDANSETRON HCL 4 MG/2ML IJ SOLN: 4 mg | INTRAVENOUS | @ 22:00:00 | Stop: 2018-10-26

## 2018-10-25 MED ADMIN — MORPHINE SULFATE (PF) 4 MG/ML IV SOLN: 4 mg | INTRAVENOUS | @ 23:00:00 | Stop: 2018-10-25 | NDC 00641612501

## 2018-10-25 MED ADMIN — SODIUM CHLORIDE 0.9 % IV BOLUS: 1000 mL | INTRAVENOUS | @ 23:00:00 | Stop: 2018-10-26 | NDC 00338004904

## 2018-10-25 MED ADMIN — FENTANYL CITRATE (PF) 100 MCG/2ML IJ SOLN: 75 ug | INTRAVENOUS | @ 22:00:00 | Stop: 2018-10-25 | NDC 00641602725

## 2018-10-25 MED ADMIN — HYDROMORPHONE HCL 1 MG/ML IJ SOLN: .5 mg | INTRAVENOUS | Stop: 2018-10-25 | NDC 00409128303

## 2018-10-25 NOTE — ED Provider Notes
[facility]  Emergency Department Service Report    Triage     Neil Howell, a 63 y.o. male, presents with Fall (Trip and Fall, Left leg pain. Deformity left leg. +CMS.)    Arrived on 10/25/2018 at 2:33 PM   Arrived by SM 5 [[id number]]    ED Triage Vitals [10/25/18 1437]   Temp Temp Source BP Heart Rate Resp SpO2 O2 Device Pain Score Weight   36.3 ???C (97.4 ???F) Oral 98/74 78 20 97 % None (Room air) Four 93 kg (205 lb)       No Known Allergies     Initial Physician Contact       Comprehensive Exam Initiated  Contact Date: 10/25/18  Contact Time: 1430    History   HPI     Neil Howell is a 63 y.o. male with history of hip fracture who presents today with leg injury. Patient had hip and prox femur repair for traumatic injury 1.5 months ago. Today, was walking, had misstep and fell and twisted his leg. Has pain in left leg below area of recent procedure. Denies chest pain, shortness of breath, or syncope. Denies head strike or LOC. No anticoagulants. Denies pain or injury elsewhere.   No numbness or tingling, able to move foot.    Patient is on midodrine for chronic hypotension; was noted to be 70s systolic en route, improved with fluids. Denies lightheadedness. No fever, cough, abdominal pain, bleeding, diarrhea, or urinary symptoms. No vomiting.      Past Medical History:   Diagnosis Date   ??? Hypotension         Past Surgical History:   Procedure Laterality Date   ??? HERNIA REPAIR      MULTIPLE, BILATERAL   ??? HIP SURGERY          Past Family History   family history is not on file.     Past Social History   he reports that he has quit smoking. He does not have any smokeless tobacco history on file. He reports current alcohol use. He reports that he does not use drugs. No history on file for sexual activity.     Review of Systems   Constitutional: Negative for fever.   Respiratory: Negative for cough and shortness of breath.    Cardiovascular: Negative for chest pain. Gastrointestinal: Negative for abdominal pain, diarrhea and vomiting.   Genitourinary: Negative for dysuria.   Neurological: Negative for weakness, numbness and headaches.   All other systems reviewed and are negative.      Physical Exam   Physical Exam  Vitals signs and nursing note reviewed.   Constitutional:       General: He is not in acute distress.     Appearance: He is well-developed. He is not diaphoretic.   HENT:      Head: Normocephalic and atraumatic.   Eyes:      Conjunctiva/sclera: Conjunctivae normal.   Cardiovascular:      Rate and Rhythm: Normal rate and regular rhythm.      Heart sounds: No murmur. No friction rub. No gallop.    Pulmonary:      Effort: Pulmonary effort is normal. No respiratory distress.      Breath sounds: Normal breath sounds. No stridor. No wheezing or rales.   Chest:      Chest wall: No tenderness.   Abdominal:      General: There is no distension.      Palpations:  Abdomen is soft. There is no mass.      Tenderness: There is no abdominal tenderness. There is no guarding or rebound.   Musculoskeletal:         General: Deformity present.      Comments: Left upper leg externally rotated at distal femur.  Distal motor and sensory intact.  2+ DP and PT pulses  Compartment soft.  Remainder of palpation of neck, clavicle, chest wall, upper extremities, pelvis, hip, lower extremities without tenderness or deformity. No midline cervical, thoracic, lumbar, or sacral spinal tenderness or step-offs. Normal passive range of motion throughout without significant discomfort.   Skin:     General: Skin is warm and dry.      Coloration: Skin is not pale.      Findings: No erythema or rash.   Neurological:      General: No focal deficit present.      Mental Status: He is alert and oriented to person, place, and time.      Cranial Nerves: No cranial nerve deficit.      Comments: CN II-XII intact. 5/5 strength in upper and lower extremities. Sensation intact to light touch throughout Psychiatric:         Behavior: Behavior normal.         Thought Content: Thought content normal.         Judgment: Judgment normal.           Medical Decision Making   Neil Howell is a 63 y.o. male presents with leg injury. History, exam, and XR consistent with distal femur fracture. Neurovascularly intact, no evidence of compartment syndrome. Patient's Hgb at recent baseline, no tense hematoma or hemodynamic instability to suggest active hemorrhage. No history to suggest syncope. No evidence of head or additional traumatic injury.    Discussed with ortho, will see in ED    Chart Review   Previous medical records requested.  Pertinent items reviewed: outside records    ED Course      Laboratory Results     Labs Reviewed   CBC - Abnormal; Notable for the following components:       Result Value    Red Blood Cell Count 3.17 (*)     Hemoglobin 8.9 (*)     Hematocrit 29.5 (*)     MCH Concentration 30.2 (*)     Red Cell Distribution Width-SD 56.0 (*)     Red Cell Distribution Width-CV 16.4 (*)     All other components within normal limits   ELECTROLYTE PANEL - Abnormal; Notable for the following components:    Chloride 109 (*)     Anion Gap 7 (*)     All other components within normal limits   CREATININE,WHOLE BLOOD - Normal   GLUCOSE - Normal   PROTHROMBIN TIME PANEL - Normal   APTT - Normal   COVID-19 PCR, RESPIRATORY UPPER   RAINBOW DRAW TO LABORATORY    Narrative:     The following orders were created for panel order Brigid Re (ED Adult Blood Draw: Nurse Protocol).  Procedure                               Abnormality         Status                     ---------                               -----------         ------  Extra Burna Mortimer UYQ[[id number]]                            Final result               Extra Burna Mortimer GLO[[id number]]                            Final result               Pink Top-Blood Bank Hold.Marland KitchenMarland Kitchen[[id number]]                      Final result Extra Gold TKZ[[id number]]                                   Final result                 Please view results for these tests on the individual orders.   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      There is a comminuted periprosthetic fracture, extending from the distal stem of the femoral arthroplasty. The distal fragment is displaced posteriorly by a full bone width and medially by greater than a full bone width. There is apex posterior    angulation.      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      There is a comminuted periprosthetic fracture, extending from the distal stem of the femoral arthroplasty. The distal fragment is displaced posteriorly by a full bone width and medially by greater than a full bone width. There is apex posterior    angulation.      Signed by: Celso Amy   10/25/2018 3:27 PM          Consults         Progress Notes / Reassessments     ED Course as of Oct 24 1955   Sun Oct 25, 2018   1508 Discussed with ortho; will see in ED    [TA]   1528 Ortho says patient should be transferred to [facility]    [TA]   1656 Patient continues to have pain. Reassessment of compartments soft; neurovascularly intact. Consented for iliaca fascia block; will proceed at bedside.    [TA]   1822 Fascia iliaca block done at bedside; patient tolerated well.    [TA]   1953 Patient accepted at [facility]. Discussed with ortho, recommend placement in Buck's traction. Patient remains stable for transfer.    [TA]      ED Course User Index  [TA] Stephanie Acre., MD         ED Procedure Notes Any ED procedures performed are documented on separate ED procedure notes.  Clinical Impression     1. Femur fracture (HCC/RAF)    2. History of left hip replacement    3. Fall, initial encounter        Disposition and Follow-up   Disposition: Transfer to Another Facility [2]    No future appointments.    Follow up with:  No follow-up provider specified.    Return precautions are specified on After Visit Summary.    New Prescriptions    No medications on file       Orders Placed This Encounter   ??? COVID-19 PCR, Nasopharyngeal   ??? XR hip ap+lat left w pelvis portable (3 Views)   ??? XR femur ap+lat left (2 views)   ??? XR knee ap+lat left (2 views)   ??? CBC without differential   ??? Electrolyte Panel [Na, K, Cl, CO2]   ??? Creatinine   ??? Glucose   ??? Prothrombin Time Panel   ??? APTT   ??? Rainbow Draw (ED Adult Blood Draw: Nurse Protocol)   ??? Extra Light Green Top   ??? Extra Light Green Top   ??? Pink Top-Blood Bank Hold Specimen   ??? Extra Gold Top   ??? Vital signs per unit routine   ??? Activity as tolerated   ??? Full Code   ??? Isolation Status: Add: Enhanced Droplet and Contact; Remove: N/A   ??? POCT hemoglobin (hemacue)   ??? ECG, 12 lead   ??? EKG REPORT   ??? ED 12 LEAD ECG    ??? EKG REPORT   ??? Type and screen   ??? Type and screen   ??? Saline lock IV   ??? Bed Request   ??? ondansetron 4 mg/2 mL inj 4 mg   ??? midodrine 2.5 mg tablet   ??? acetaminophen 500 mg tablet   ??? amitriptyline 50 mg tablet   ??? aspirin 81 mg EC tablet   ??? calcium carbonate 500 mg chewable tablet   ??? Cholecalciferol 50 mcg (2000 units) TABS   ??? docusate 100 mg capsule   ??? DULoxetine 30 mg DR capsule   ??? enoxaparin 40 mg/0.4 mL injection   ??? famotidine 20 mg tablet   ??? ferrous gluconate 324 mg tablet   ??? gabapentin 300 mg capsule   ??? HYDROcodone-acetaminophen 5-325 mg tablet   ??? oxyCODONE-acetaminophen 10-325 mg tablet   ??? oxymetazoline 0.05% nasal spray   ??? OYSCO 500 500 MG tablet   ??? pantoprazole 40 mg DR tablet   ??? polyethylene glycol powder packet ??? senna 8.6 mg tablet   ??? traMADol 50 mg tablet   ??? traZODone 100 mg tablet   ??? fentaNYL (PF) 100 mcg/2 mL inj 75 mcg   ??? fentaNYL (PF) 100 mcg/2 mL inj   ??? sodium chloride 0.9% IV soln bolus 1,000 mL   ??? morphine PF 4 mg/mL inj 4 mg   ??? bupivacaine PF 0.5% inj 30 mL   ??? acetaminophen tab 1,000 mg   ??? HYDROmorphone 1 mg/mL inj 0.5 mg       Scribe Signature       Resident Signature            Stephanie Acre., MD  Resident  10/25/18 [phone]

## 2018-10-25 NOTE — ED Notes
COLLECTIVE?NOTIFICATION?10/25/2018 14:33?Juluis Mire?MRN: [id number]    Criteria Met      2 Visits in 30 Days    CURES    Security and Safety  No recent Security Events currently on file    ED Care Guidelines  There are currently no ED Care Guidelines for this patient. Please check your facility's medical records system.        Prescription Drug Report (12 Mo.)  Rx Details  Fill Date Drug Description Qty. Prescriber   2018-10-19 OXYCODONE HCL-ACETAMINOPHEN 60 Sharma Covert   2018-09-30 TRAMADOL HCL 56 CUSHING, TIMOTHY   2018-09-28 CLONAZEPAM 60 WEI DOHERTY, JILL   2018-09-10 TRAMADOL HCL 56 CUSHING, TIMOTHY   2018-08-05 CLONAZEPAM 60 WEI DOHERTY, JILL   2018-07-06 CLONAZEPAM 60 WEI DOHERTY, JILL   2018-06-08 CLONAZEPAM 60 WEI DOHERTY, JILL   2018-05-05 CLONAZEPAM 14 WEI DOHERTY, JILL   2018-04-16 [id number] 60 WEI DOHERTY, JILL   2018-04-06 CLONAZEPAM 60 CHEN, MEGAN E   2018-03-09 CLONAZEPAM 60 CHEN, MEGAN E   2018-03-01 OXYCODONE HCL-ACETAMINOPHEN 30 STEINBRECH, DOUGLAS, STEVEN   2018-02-10 CLONAZEPAM 60 CHEN, MEGAN E   2018-01-30 ZALEPLON 60 WEI DOHERTY, JILL   2018-01-27 OXYCODONE HCL-ACETAMINOPHEN 30 STEINBRECH, DOUGLAS, STEVEN   2018-01-08 CLONAZEPAM 60 WEI DOHERTY, JILL   2017-12-13 CLONAZEPAM 60 WEI DOHERTY, JILL   2017-11-20 ZALEPLON 60 WEI DOHERTY, JILL   2017-11-15 CLONAZEPAM 60 WEI DOHERTY, JILL   2017-11-11 CLONAZEPAM 10 WEI DOHERTY, JILL     Rx Summary  Metric Count   Quantity Dispensed 1,036   Unique Prescribers 5   Unique Pharmacies 1         E.D. Visit Count (12 mo.)  Facility Visits   [place] 2   [facility] [place] 1   Total 3   Note: Visits indicate total known visits.      Recent Emergency Department Visit Summary  Date Facility [facility] Type Diagnoses or Chief Complaint   Oct 25, 2018 [facility] [place] [facility]     Oct 01, 2018 [place] 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 [place] [place] [facility]      Fall      Hip Pain      Unspecified fall, initial encounter      Fracture of unspecified part of neck of left femur, initial e          Recent Inpatient Visit Summary  Date Facility [facility] Type Diagnoses or Chief Complaint   Oct 01, 2018 [place] [place]      Presence of unspecified artificial hip joint      Periprosthetic fracture around other internal prosthetic join      Pain in left hip      Unspecified intracapsular fracture of left femur, initial enc      Acute posthemorrhagic anemia      Aug 22, [facility] [place] [facility]     Aug 21, 2018 [place]      Fracture of unspecified part of neck of left femur, initial e      Unspecified fall, initial encounter      Anxiety disorder, unspecified      Other chronic pain  Vitamin D deficiency, unspecified      Low back pain      Postprocedural fever      Acute posthemorrhagic anemia      Other specified anxiety disorders      Insomnia, unspecified          Care Team  [facility] Phone Fax Service Dates   Vickey Huger , M.D. Family Medicine [phone] Current    [facility]   Current      Collective Portal  This patient has registered at the [facility] Emergency Department   For more information visit: [email or web address]   PLEASE NOTE:     1.   Any care recommendations and other clinical information are provided as guidelines or for historical purposes only, and providers should exercise their own clinical judgment when providing care.    2.   You may only use this information for purposes of treatment, payment or health care operations activities, and subject to the limitations of applicable Collective Policies.    3.   You should consult directly with the organization that provided a care guideline or other clinical history with any questions about additional information or accuracy or completeness of information provided.    ? 2020 [place], [employer] - [email or web address]

## 2018-10-25 NOTE — Consults
10/25/2018 3:53 PM    ED CASE MANAGER NOTES        TRANSFER       TRANSFER LOCATION  []  [facility] [place]    []  [facility]  []  Outside [facility]       Call received from ED MD stating patient needs to transfer to [facility] for continuity of care for Dx Femur fracture Hx surgery at [facility]. This CM called [facility] transfer center [phone] and spoke with Weston Brass. Clinicals and face sheet hard faxed to [phone]. Awaiting insurance auth. Will follow.    1632 Per FC, unable to obtain insurance information. This CM spoke with patient at bedside who stated has [facility] PPO. FC stated will try again to verify and upload to facesheet.     1643 Facesheet updated with insurance information. Per Care Everywhere, Dr Erick Blinks was [facility] ortho surgeon. Additional info faxed to [facility] transfer center [phone] Nat Math Per [facility] [phone], reviewing case and will call back. Will follow.    5784 Per [facility] transfer center [phone], awaiting CM to review case.      1830 Per [facility] transfer center [phone], awaiting accpeting MD. Will follow.    1855 Call received from [facility] [phone], with bed info. ED resident updated. Please see transfer check list. CWA updated.    Leslie Andrea, RN BSN  Clinical Case Manager   Emergency Department   Ext [phone] pager [phone]

## 2018-10-26 LAB — COVID-19 PCR: COVID-19 PCR: NOT DETECTED

## 2018-10-26 MED ADMIN — BUPIVACAINE HCL (PF) 0.5 % IJ SOLN: 30 mL | INTRADERMAL | @ 02:00:00 | Stop: 2018-10-26 | NDC 00409116202

## 2018-10-26 MED ADMIN — ACETAMINOPHEN 500 MG PO TABS: 1000 mg | ORAL | @ 03:00:00 | Stop: 2018-10-26 | NDC 00904673061

## 2018-10-26 MED ADMIN — MORPHINE SULFATE (PF) 2 MG/ML IV SOLN: 6 mg | INTRAVENOUS | @ 03:00:00 | Stop: 2018-10-26 | NDC 00409189003

## 2018-10-26 NOTE — ED Notes
ERMD notified of pts continued pain. States he will reassess pt.

## 2018-10-26 NOTE — ED Procedure Note
[facility]  Emergency Department Procedure Note    ED Procedures     Nerve Block  Date/Time: 10/25/2018 11:48 PM  Performed by: Beckey Rutter., MD  Authorized by: Tinnie Gens., MD, MPH   Consent: Verbal consent obtained. Written consent obtained.  Risks and benefits: risks, benefits and alternatives were discussed  Consent given by: patient  Patient understanding: patient states understanding of the procedure being performed  Patient consent: the patient's understanding of the procedure matches consent given  Procedure consent: procedure consent matches procedure scheduled  Relevant documents: relevant documents present and verified  Test results: test results available and properly labeled  Site marked: the operative site was marked  Imaging studies: imaging studies available  Required items: required blood products, implants, devices, and special equipment available  Patient identity confirmed: verbally with patient and provided demographic data  Time out: Immediately prior to procedure a ''time out'' was called to verify the correct patient, procedure, equipment, support staff and site/side marked as required.  Indications: pain relief and fracture  Body area: lower extremity (Fascia iliaca)  Laterality: left    Sedation:  Patient sedated: no    Preparation: Patient was prepped and draped in the usual sterile fashion.  Patient position: supine  Needle size: 20 G  Location technique: ultrasound guidance  Local Anesthetic: bupivacaine 0.5% without epinephrine  Anesthetic total: 18 mL  Outcome: pain improved  Patient tolerance: Patient tolerated the procedure well with no immediate complications                       Beckey Rutter., MD  Resident  10/25/18 2351

## 2018-10-26 NOTE — ED Notes
Report received from [facility]. Pt is resting in supine position. Unlabored respirations, skin warm, dry with appropriate color, left leg shortening noted, dressing on left lateral thigh clean, dry and intact. Pedal pulses palpable, capillary refill of left toes palpable. Left knee deformity present. Pt c/o continued pain. Will discuss with ERMD.

## 2018-10-26 NOTE — ED Notes
Pts left thigh swollen with mild firmness.ERMD. Dr. Marin Comment notified.

## 2018-10-26 NOTE — ED Notes
Patient discharged, was picked up by EMT. VS taken and recorded, left pedal pulse strong and palpable. Report given to [facility] EMT. Pt condition during transfer, stable.

## 2018-10-26 NOTE — ED Notes
Report given to Jina RN

## 2018-10-26 NOTE — ED Notes
Rounding, pt continues to request pain medication, states, ''The Dilaudid did absolutely nothing. When the supervising doctor was here, she said it would be a good idea if I got whatever pain medication I wanted.'' Therapeutic communication utilized, Dr. Alveda Reasons notified.

## 2018-10-26 NOTE — ED Notes
Pt requesting additional pain meds, states, ''The dilaudid did absolutely nothing.'' Dr. Marin Comment notified.

## 2018-10-26 NOTE — ED Notes
Report received from [facility] for 1 hour lunch coverage.

## 2021-06-04 MED ORDER — CLONAZEPAM 1 MG PO TABS
ORAL_TABLET | 0 refills
Start: 2021-06-04 — End: ?
